# Patient Record
Sex: Male | Born: 1985 | Race: Black or African American | Hispanic: No | Marital: Married | Smoking: Current every day smoker
Health system: Southern US, Community
[De-identification: ages and names within clinical notes are randomized; demographics above are authoritative.]

## PROBLEM LIST (undated history)

## (undated) DIAGNOSIS — F319 Bipolar disorder, unspecified: Secondary | ICD-10-CM

---

## 2006-02-24 ENCOUNTER — Emergency Department: Payer: Self-pay | Admitting: Emergency Medicine

## 2006-09-09 ENCOUNTER — Emergency Department: Payer: Self-pay | Admitting: Unknown Physician Specialty

## 2007-05-18 ENCOUNTER — Ambulatory Visit: Payer: Self-pay | Admitting: Nurse Practitioner

## 2008-03-06 ENCOUNTER — Emergency Department: Payer: Self-pay | Admitting: Emergency Medicine

## 2008-06-19 ENCOUNTER — Inpatient Hospital Stay: Payer: Self-pay | Admitting: Psychiatry

## 2008-07-02 ENCOUNTER — Emergency Department: Payer: Self-pay | Admitting: Internal Medicine

## 2010-11-16 ENCOUNTER — Emergency Department: Payer: Self-pay | Admitting: Emergency Medicine

## 2010-11-18 ENCOUNTER — Emergency Department: Payer: Self-pay | Admitting: Emergency Medicine

## 2010-11-20 ENCOUNTER — Emergency Department: Payer: Self-pay | Admitting: Emergency Medicine

## 2010-12-05 ENCOUNTER — Emergency Department: Payer: Self-pay | Admitting: Emergency Medicine

## 2013-01-29 IMAGING — CR DG LUMBAR SPINE 2-3V
1 series · 3 of 3 positions shown · non-contrast
Comparison: none

REASON FOR EXAM: worsening pain with radiation posterior thigh sp fall
COMMENTS:

[Series 1: view not recorded · 0.17mm/px · 3 of 3 slices shown]
[im 1/3]
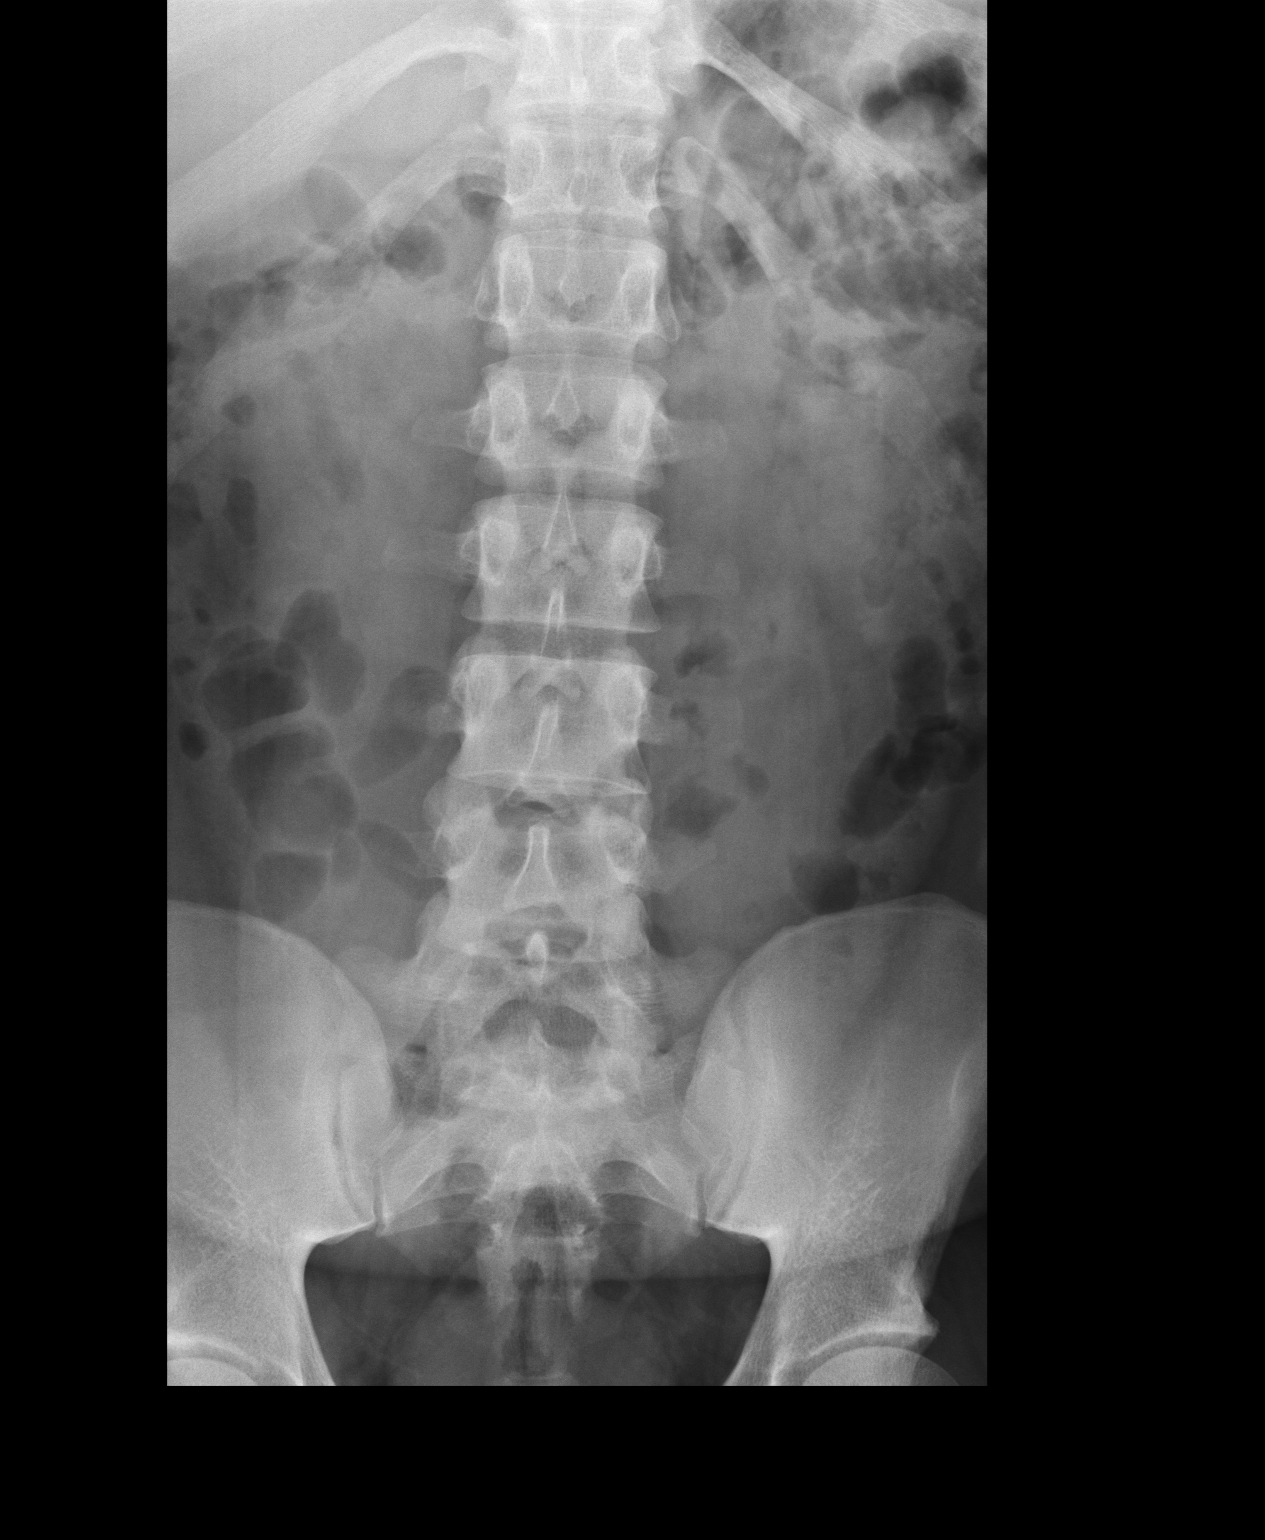
[im 2/3]
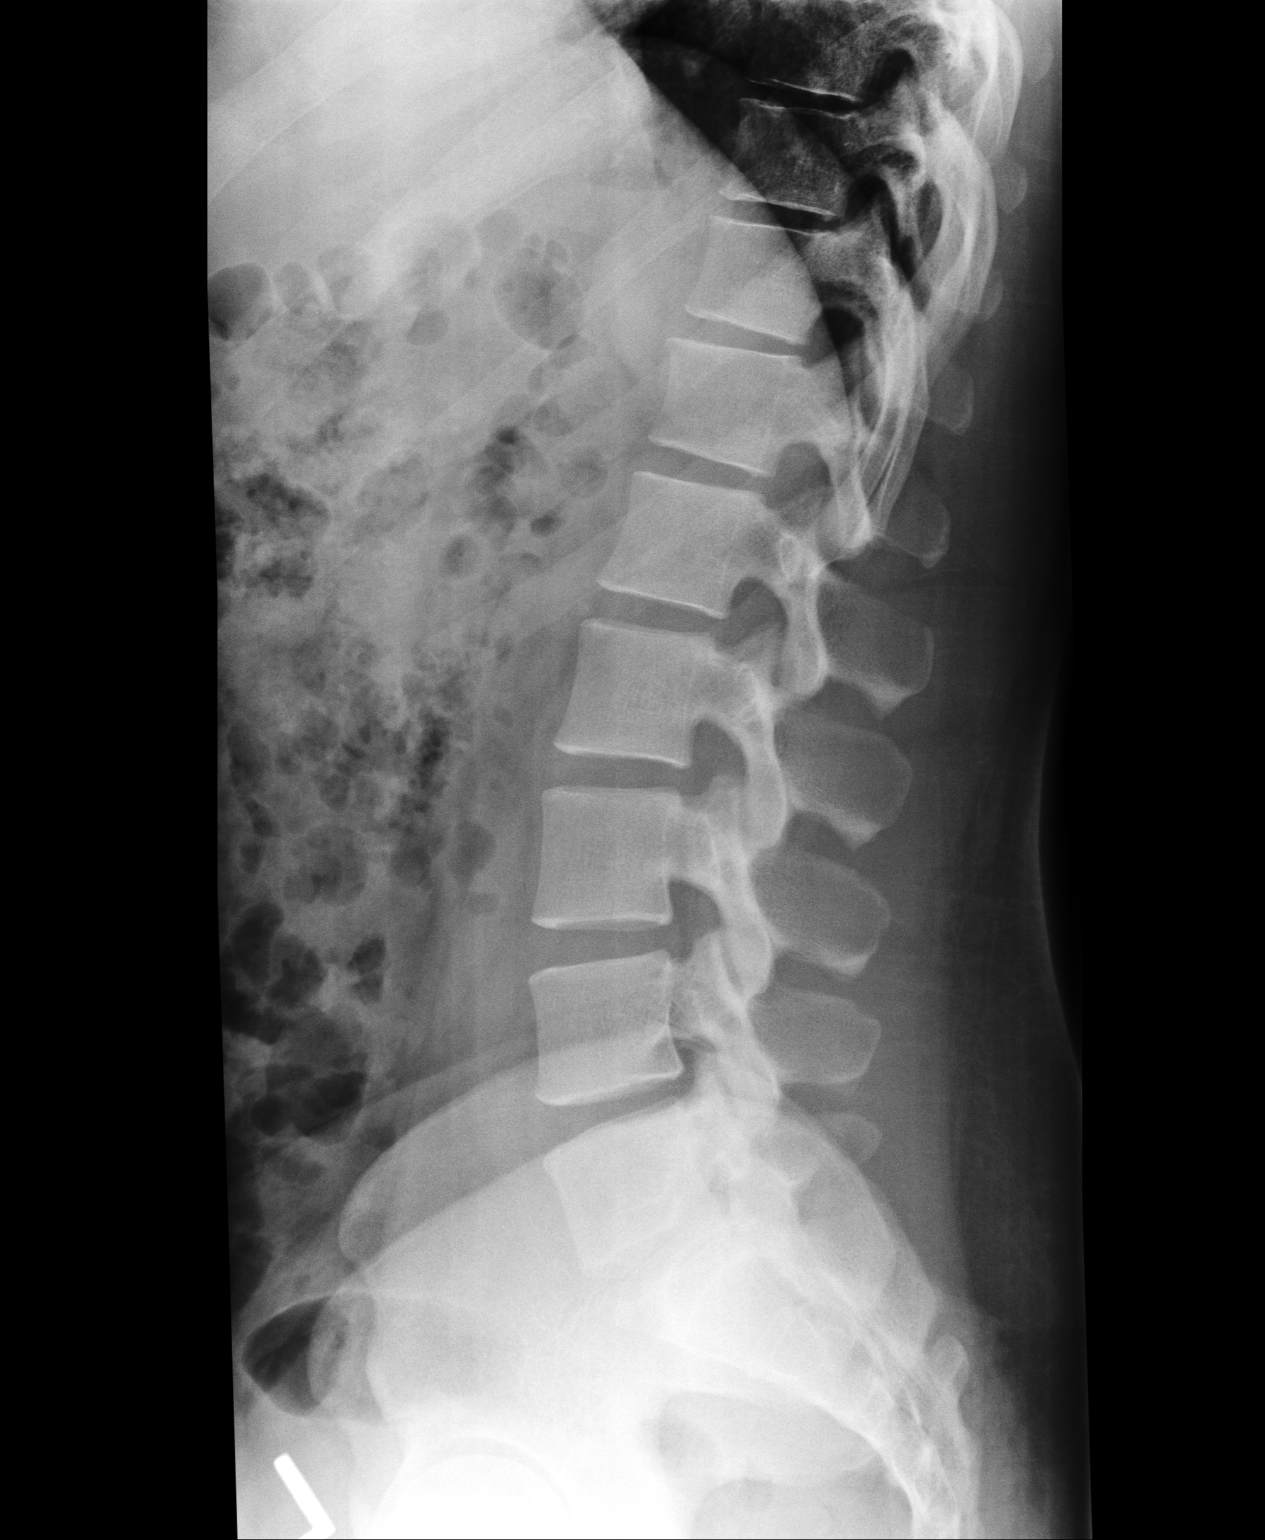
[im 3/3]
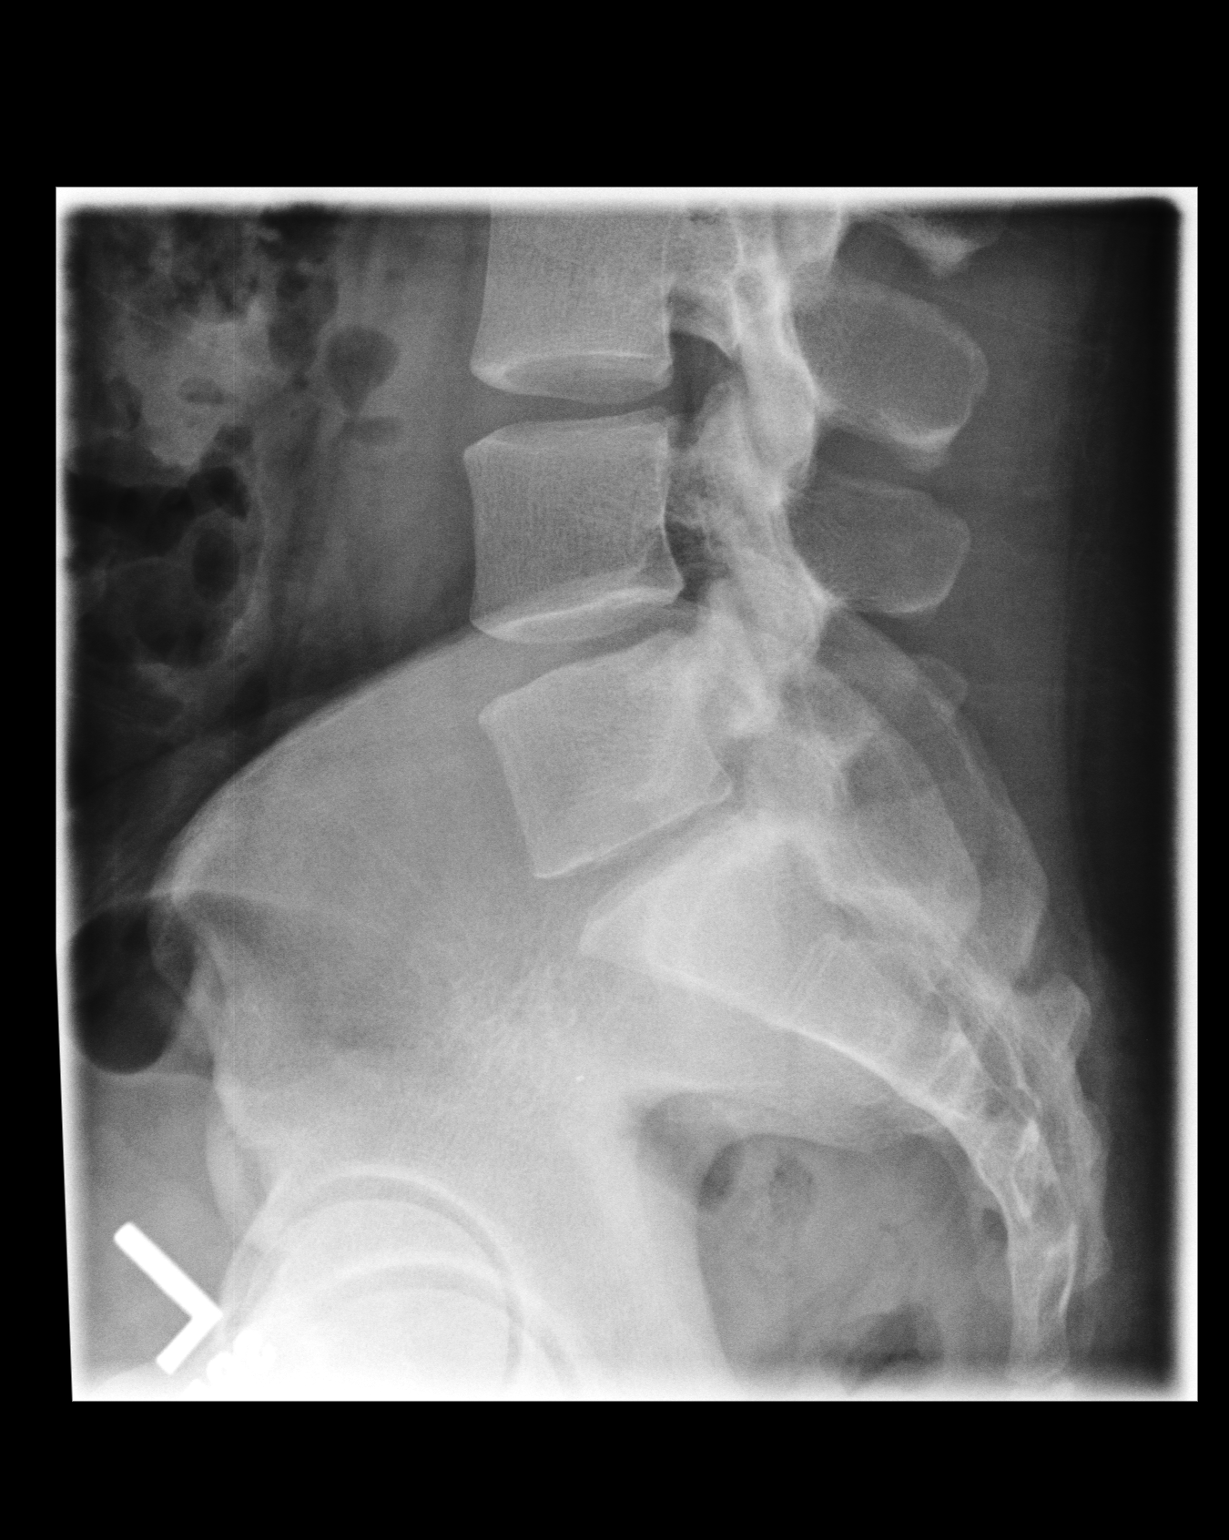

[3 of 3 positions shown; findings below may reference images not displayed]

PROCEDURE:     DXR - DXR LUMBAR SPINE AP AND LATERAL  - November 20, 2010  [DATE]

RESULT:     Comparison is made to the study 18 May, 2007.

The lumbar vertebral bodies are preserved in height. There is mild disc
space narrowing at L4-L5 which is slightly more conspicuous than on the
earlier study. The spinous processes are intact. I see no spondylolisthesis.
The transverse processes are grossly normal. The SI joints exhibit no acute
abnormality.
IMPRESSION: There is disc space narrowing at the L4-L5 level which is
not new but which is slightly more conspicuous than on the previous study.
There is no evidence of an acute compression fracture nor of a pars defect
or spondylolisthesis.

## 2016-10-23 ENCOUNTER — Emergency Department
Admission: EM | Admit: 2016-10-23 | Discharge: 2016-10-24 | Disposition: A | Discharge status: Home / Self Care | Payer: Self-pay | Attending: Emergency Medicine | Admitting: Emergency Medicine

## 2016-10-23 DIAGNOSIS — F1210 Cannabis abuse, uncomplicated: Secondary | ICD-10-CM

## 2016-10-23 DIAGNOSIS — R45850 Homicidal ideations: Secondary | ICD-10-CM | POA: Insufficient documentation

## 2016-10-23 DIAGNOSIS — F17200 Nicotine dependence, unspecified, uncomplicated: Secondary | ICD-10-CM | POA: Insufficient documentation

## 2016-10-23 DIAGNOSIS — F29 Unspecified psychosis not due to a substance or known physiological condition: Secondary | ICD-10-CM | POA: Insufficient documentation

## 2016-10-23 DIAGNOSIS — F4325 Adjustment disorder with mixed disturbance of emotions and conduct: Secondary | ICD-10-CM

## 2016-10-23 HISTORY — DX: Bipolar disorder, unspecified: F31.9

## 2016-10-23 LAB — URINE DRUG SCREEN, QUALITATIVE (ARMC ONLY)
AMPHETAMINES, UR SCREEN: NOT DETECTED
Barbiturates, Ur Screen: NOT DETECTED
Benzodiazepine, Ur Scrn: NOT DETECTED
CANNABINOID 50 NG, UR ~~LOC~~: POSITIVE — AB
COCAINE METABOLITE, UR ~~LOC~~: NOT DETECTED
MDMA (ECSTASY) UR SCREEN: NOT DETECTED
Methadone Scn, Ur: NOT DETECTED
Opiate, Ur Screen: NOT DETECTED
Phencyclidine (PCP) Ur S: NOT DETECTED
TRICYCLIC, UR SCREEN: NOT DETECTED

## 2016-10-23 LAB — COMPREHENSIVE METABOLIC PANEL
ALT: 17 U/L (ref 17–63)
AST: 19 U/L (ref 15–41)
Albumin: 4.6 g/dL (ref 3.5–5.0)
Alkaline Phosphatase: 60 U/L (ref 38–126)
Anion gap: 8 (ref 5–15)
BUN: 12 mg/dL (ref 6–20)
CHLORIDE: 111 mmol/L (ref 101–111)
CO2: 22 mmol/L (ref 22–32)
CREATININE: 1.23 mg/dL (ref 0.61–1.24)
Calcium: 9.3 mg/dL (ref 8.9–10.3)
GFR calc non Af Amer: >60 mL/min (ref >60)
Glucose, Bld: 100 mg/dL — ABNORMAL HIGH (ref 65–99)
POTASSIUM: 3.8 mmol/L (ref 3.5–5.1)
SODIUM: 141 mmol/L (ref 135–145)
Total Bilirubin: 0.6 mg/dL (ref 0.3–1.2)
Total Protein: 7.8 g/dL (ref 6.5–8.1)

## 2016-10-23 LAB — CBC
HCT: 49.5 % (ref 40.0–52.0)
Hemoglobin: 17 g/dL (ref 13.0–18.0)
MCH: 32.4 pg (ref 26.0–34.0)
MCHC: 34.4 g/dL (ref 32.0–36.0)
MCV: 94 fL (ref 80.0–100.0)
Platelets: 146 10*3/uL — ABNORMAL LOW (ref 150–440)
RBC: 5.27 MIL/uL (ref 4.40–5.90)
RDW: 12.5 % (ref 11.5–14.5)
WBC: 7.7 10*3/uL (ref 3.8–10.6)

## 2016-10-23 LAB — ETHANOL: Alcohol, Ethyl (B): <5 mg/dL (ref <5)

## 2016-10-23 LAB — ACETAMINOPHEN LEVEL: Acetaminophen (Tylenol), Serum: <10 ug/mL — ABNORMAL LOW (ref 10–30)

## 2016-10-23 LAB — SALICYLATE LEVEL

## 2016-10-23 NOTE — ED Notes (Signed)
Pt. 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  Pt. Believes he has a twin, "like a stunt double".   Pt. Denies pych. Hx.  Pt. Denies he is on any medication.  Pt. States he has moved around a lot from [place]  Pt. Denies SI and would only hurt someone if his life was in danger.  Pt. Cooperative with this nurse, but paranoid of family members and intentions of family members.
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
## 2016-10-23 NOTE — ED Notes (Signed)
PT IVC/ PENDING SOC CONSULT  

## 2016-10-23 NOTE — ED Notes (Signed)
Pt. Requested meal tray and something to drink.  Pt. Given meal tray and cup of water.  Pt. Was grateful for food.
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
## 2016-10-23 NOTE — ED Notes (Signed)
Pt. To BHU from ED ambulatory without difficulty, to room  [facility]. [facility] Pt. Is alert and oriented, warm and dry in no distress. Pt. Denies SI, HI, and AVH. Pt. Calm and cooperative. Pt. Made aware of security cameras and Q15 minute rounds. Pt. Encouraged to let Nursing staff know of any concerns or needs.

## 2016-10-23 NOTE — ED Notes (Signed)
[facility]

## 2016-10-23 NOTE — ED Notes (Signed)
SOC    CALLED 
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
## 2016-10-23 NOTE — ED Notes (Signed)

## 2016-10-24 DIAGNOSIS — F4325 Adjustment disorder with mixed disturbance of emotions and conduct: Secondary | ICD-10-CM

## 2016-10-24 DIAGNOSIS — F1210 Cannabis abuse, uncomplicated: Secondary | ICD-10-CM

## 2016-10-24 MED ORDER — LORAZEPAM 1 MG PO TABS: 1.0000 mg | ORAL_TABLET | ORAL | Status: DC | PRN

## 2016-10-24 NOTE — ED Notes (Signed)
Pt discharged to lobby. All belongings returned to patient. Denies SI/HI and AVH. Discharge paperwork reviewed with patient.

## 2016-10-24 NOTE — ED Notes (Signed)
Patient resting quietly in room. No noted distress or abnormal behaviors noted. Will continue 15 minute checks and observation by security camera for safety. 

## 2016-10-24 NOTE — ED Notes (Signed)
SOC complete. SOC recommends inpatient hospitalization at this time.
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
## 2016-10-24 NOTE — BH Assessment (Signed)
Assessment Note  Miguel Reed is an 31 y.o. male presenting to the Ed under IVC, initiated by his family, after expressing homicidal threats against family members.  Patient [place] was [place] a family member's funeral.  He reports getting into an argument with family members and says that he felt threatened.  Patients states, "I will defend myself if they come at me".    Patient denies SI and any auditory/visual hallucinations.  He denies any previous psychiatric hospitalizations/treatment.  Pt denies drug/alcohol use although his UDS was positive for cannabis.  Diagnosis: Bipolar Disorder  Past Medical History:  Past Medical History:  Diagnosis Date  . Bipolar 1 disorder (HCC)     History reviewed. No pertinent surgical history.  Family History: No family history on file.  Social History:  reports that he has been smoking.  He has never used smokeless tobacco. He reports that he drinks alcohol. He reports that he uses drugs, including Marijuana.  Additional Social History:  Alcohol / Drug Use Pain Medications: See PTA Prescriptions: See PTA Over the Counter: See PTA History of alcohol / drug use?: No history of alcohol / drug abuse (Pt denies)  CIWA: CIWA-Ar BP: 123/87 Pulse Rate: 70 COWS:    Allergies: No Known Allergies  Home Medications:  (Not in a hospital admission)  OB/GYN Status:  No LMP for male patient.  General Assessment Data Location of Assessment: [facility] ED TTS Assessment: In system Is this a Tele or Face-to-Face Assessment?: Face-to-Face Is this an Initial Assessment or a Re-assessment for this encounter?: Initial Assessment Marital status: Single Maiden name: n/a Is patient pregnant?: No Pregnancy Status: No Living Arrangements: Other relatives Can pt return to current living arrangement?: Yes Admission Status: Involuntary Is patient capable of signing voluntary admission?: No (Pt IVC) Referral Source:  Self/Family/Friend Insurance type: None     Crisis Care Plan Living Arrangements: Other relatives Legal Guardian: Other: (self) Name of Psychiatrist: none reported Name of Therapist: none reported  Education Status Is patient currently in school?: No Current Grade: na Highest grade of school patient has completed: na Name of school: na Contact person: na  Risk to self with the past 6 months Suicidal Ideation: No Has patient been a risk to self within the past 6 months prior to admission? : No Suicidal Intent: No Has patient had any suicidal intent within the past 6 months prior to admission? : No Is patient at risk for suicide?: No Suicidal Plan?: No Has patient had any suicidal plan within the past 6 months prior to admission? : No Access to Means: No What has been your use of drugs/alcohol within the last 12 months?: Pt denies but tested positive for cannabis Previous Attempts/Gestures: No How many times?: 0 Other Self Harm Risks: none identified Triggers for Past Attempts: None known Intentional Self Injurious Behavior: None Family Suicide History: No Recent stressful life event(s): Conflict (Comment) Persecutory voices/beliefs?: No Depression: No Substance abuse history and/or treatment for substance abuse?: No (Pt denies but UDS positive for cannabis) Suicide prevention information given to non-admitted patients: Not applicable  Risk to Others within the past 6 months Homicidal Ideation: Yes-Currently Present Does patient have any lifetime risk of violence toward others beyond the six months prior to admission? : No Thoughts of Harm to Others: Yes-Currently Present Comment - Thoughts of Harm to Others: Pt states "I will defend myself if they bother me" Current Homicidal Intent: No Current Homicidal Plan: No Access to Homicidal Means:  No Identified Victim: none identified History of harm to others?: No Assessment of Violence: None Noted Does patient have access  to weapons?: No Criminal Charges Pending?: No Does patient have a court date: No Is patient on probation?: Unknown  Psychosis Hallucinations: None noted Delusions: None noted  Mental Status Report Appearance/Hygiene: In scrubs Eye Contact: Good Motor Activity: Freedom of movement Speech: Logical/coherent, Aggressive Level of Consciousness: Irritable, Alert Mood: Suspicious, Irritable Affect: Irritable, Anxious Anxiety Level: Minimal Thought Processes: Relevant Judgement: Partial Orientation: Person, Place, Time, Situation Obsessive Compulsive Thoughts/Behaviors: None  Cognitive Functioning Concentration: Normal Memory: Recent Intact, Remote Intact IQ: Average Insight: Fair Impulse Control: Fair Appetite: Good Sleep: No Change Vegetative Symptoms: None  ADLScreening [facility] Assessment Services) Patient's cognitive ability adequate to safely complete daily activities?: Yes Patient able to express need for assistance with ADLs?: Yes Independently performs ADLs?: Yes (appropriate for developmental age)  Prior Inpatient Therapy Prior Inpatient Therapy: No Prior Therapy Dates: na Prior Therapy Facilty/Provider(s): na Reason for Treatment: na  Prior Outpatient Therapy Prior Outpatient Therapy: No Prior Therapy Dates: na Prior Therapy Facilty/Provider(s): na Reason for Treatment: na Does patient have an ACCT team?: Unknown Does patient have Intensive In-House Services?  : No Does patient have [facility] services? : Unknown Does patient have P4CC services?: Unknown  ADL Screening (condition at time of admission) Patient's cognitive ability adequate to safely complete daily activities?: Yes Is the patient deaf or have difficulty hearing?: No Does the patient have difficulty seeing, even when wearing glasses/contacts?: No Does the patient have difficulty concentrating, remembering, or making decisions?: No Patient able to express need for assistance with ADLs?: Yes Does the  patient have difficulty dressing or bathing?: No Independently performs ADLs?: Yes (appropriate for developmental age) Does the patient have difficulty walking or climbing stairs?: No Weakness of Legs: None Weakness of Arms/Hands: None  Home Assistive Devices/Equipment Home Assistive Devices/Equipment: None  Therapy Consults (therapy consults require a physician order) PT Evaluation Needed: No OT Evalulation Needed: No SLP Evaluation Needed: No Abuse/Neglect Assessment (Assessment to be complete while patient is alone) Physical Abuse: Denies Verbal Abuse: Denies Sexual Abuse: Denies Exploitation of patient/patient's resources: Denies Self-Neglect: Denies Values / Beliefs Cultural Requests During Hospitalization: None Spiritual Requests During Hospitalization: None Consults Spiritual Care Consult Needed: No Social Work Consult Needed: No Merchant navy officer (For Healthcare) Does Patient Have a Medical Advance Directive?: No Would patient like information on creating a medical advance directive?: No - Patient declined    Additional Information 1:1 In Past 12 Months?: No CIRT Risk: No Elopement Risk: No Does patient have medical clearance?: Yes     Disposition:  Disposition Initial Assessment Completed for this Encounter: Yes Disposition of Patient: Other dispositions Other disposition(s): Other (Comment) (Pending Psych MD consult)  On Site Evaluation by:   Reviewed with Physician:    Antonis Lor C Catie Chiao 10/24/2016 12:16 AM

## 2016-10-24 NOTE — ED Notes (Signed)
Pt unhappy about being in the hospital. "I'm not going to take any meds, I'm not crazy. I shouldn't be here."   Pt denies SI/HI and AVH.  Pt sitting in dayroom. No threatening or aggressive behavior. Maintained on 15 minute checks and observation by security camera for safety.

## 2016-10-24 NOTE — ED Notes (Signed)
Pt pacing the unit wanting to speak with the doctor.  No inappropriate or threatening behavior. Maintained on 15 minute checks and observation by security camera for safety.

## 2016-10-24 NOTE — ED Notes (Signed)
Pt to be discharged home. Pt accepting. Pt denies SI/HI and AVH.

## 2016-10-24 NOTE — ED Notes (Signed)
Report given to SOC 

## 2017-03-07 ENCOUNTER — Inpatient Hospital Stay
Admit: 2017-03-07 | Discharge: 2017-03-07 | Disposition: A | Discharge status: Home / Self Care | Payer: MEDICAID | Source: Other Acute Inpatient Hospital

## 2017-03-07 DIAGNOSIS — R0981 Nasal congestion: Secondary | ICD-10-CM

## 2017-03-07 DIAGNOSIS — J069 Acute upper respiratory infection, unspecified: Secondary | ICD-10-CM

## 2017-03-07 DIAGNOSIS — R0602 Shortness of breath: Secondary | ICD-10-CM

## 2017-03-07 MED ORDER — IBUPROFEN 600 MG PO TABS
600 mg | ORAL_TABLET | Freq: Four times a day (QID) | ORAL | 0 refills | Status: AC | PRN
Start: 2017-03-07 — End: ?

## 2017-03-07 MED ORDER — IPRATROPIUM BROMIDE 0.03 % NA SOLN
2 | Freq: Two times a day (BID) | NASAL | 0 refills | Status: AC
Start: 2017-03-07 — End: ?

## 2017-03-07 NOTE — ED Provider Notes
[facility]  Emergency Department Service Report    Victor Caldwell 32 y.o. male , presents with URI      Triage   Arrived on 03/07/2017 at 4:33 AM   Arrived by Walk-in [14]    ED Triage Vitals [03/07/17 0443]   Temp Temp Source BP Heart Rate Resp SpO2 O2 Device Pain Score Weight   37.3 ???C (99.1 ???F) Oral 124/77 105 18 96 % None (Room air) Zero --       Pre hospital care:       No Known Allergies    History   The history is provided by the patient.   Shortness of Breath    The current episode started 3 to 5 days ago. The onset was gradual. The problem occurs occasionally. The problem has been unchanged. The problem is mild. Nothing relieves the symptoms. The symptoms are aggravated by smoke exposure. Associated symptoms include rhinorrhea, cough and shortness of breath. Pertinent negatives include no chest pain, no chest pressure, no fever and no sore throat. Associated symptoms comments: Nasal congestion    . The cough has no precipitants. The cough is non-productive. The rhinorrhea has been occurring intermittently. There were no sick contacts.     Patient states that his main complaint is nasal congestion. Denies any fever or sore throat. No CP, nausea or vomiting.         History reviewed. No pertinent past medical history.     History reviewed. No pertinent surgical history.     Past Family History   family history is not on file.     Past Social History   he reports that he has been smoking.  He has never used smokeless tobacco. He reports that he does not drink alcohol or use drugs. His sexual activity history is not on file.     Review of Systems   Constitutional: Negative for fever.   HENT: Positive for rhinorrhea. Negative for sore throat.    Respiratory: Positive for cough and shortness of breath.    Cardiovascular: Negative for chest pain and palpitations.   Gastrointestinal: Negative for abdominal pain, nausea and vomiting.   All other systems reviewed and are negative. Physical Exam   Physical Exam   Constitutional: He appears well-developed and well-nourished. No distress.   HENT:   Head: Normocephalic and atraumatic.   Mouth/Throat: No oropharyngeal exudate.   Nasal congestion  NO Maxillary or frontal sinus TTP   Eyes: Conjunctivae are normal.   Neck: Normal range of motion. Neck supple.   Cardiovascular: Normal rate, regular rhythm and normal heart sounds.    Pulmonary/Chest: Effort normal and breath sounds normal. No respiratory distress.   Abdominal: Soft. Bowel sounds are normal. He exhibits no distension. There is no tenderness. There is no rebound, no guarding, no tenderness at McBurney's point and negative Murphy's sign.   Musculoskeletal: Normal range of motion.   Neurological: He is alert.   Skin: Skin is warm and dry.   Psychiatric: He has a normal mood and affect.   Nursing note and vitals reviewed.      ED Course             Laboratory Results   Labs Reviewed - No data to display    Imaging Results     No orders to display       Administered Medications     Medication Administration from 03/07/2017 0433 to 03/07/2017 0600     None  Procedures   Procedures    MDM    MDM:  Patient presents to ED with acute dyspnea, cough and congestio. Patient evaluated for acute causes of shortness of breath such as pneumonia, pneumothorax, pulmonary embolism with no evidence of these acute conditions. Patient with signs and symptoms of acute bronchitis. Patient without respiratory distress at the time of discharge. Normal cardiopulmonary exam and normal vital signs to rule out these other acute pathologies. No clinical evidence of pneumonia, otitis media, strep pharyngitis. Patient well appearing. Patent airway. Clinically stable for discharge. Patient tolerating PO without difficulty. Well hydrated. Will discharge with medication for symptom control.       Clinical Impression     1. Shortness of breath    2. Acute URI    3. Nasal congestion        Prescriptions New Prescriptions    IBUPROFEN 600 MG TABLET    Take 1 tablet (600 mg total) by mouth every six (6) hours as needed.    IPRATROPIUM 0.03% NASAL SPRAY    Spray 2 sprays in each nare every twelve (12) hours.       Disposition and Follow-up   Disposition: Discharge [1]    No future appointments.    Follow up with:  Pcp, No, MD    In 3 days      [facility] Emergency Department  [address]  [place] [address]  [phone]    If symptoms worsen      Return precautions are specified on After Visit Summary.                 Virl Cagey D., MD  03/07/17 [phone]

## 2017-09-26 ENCOUNTER — Emergency Department (HOSPITAL_COMMUNITY)
Admission: EM | Admit: 2017-09-26 | Discharge: 2017-09-27 | Disposition: A | Discharge status: Home / Self Care | Payer: Self-pay | Attending: Emergency Medicine | Admitting: Emergency Medicine

## 2017-09-26 ENCOUNTER — Emergency Department (HOSPITAL_COMMUNITY): Payer: Self-pay

## 2017-09-26 DIAGNOSIS — Y92521 Bus station as the place of occurrence of the external cause: Secondary | ICD-10-CM | POA: Insufficient documentation

## 2017-09-26 DIAGNOSIS — S060X0A Concussion without loss of consciousness, initial encounter: Secondary | ICD-10-CM | POA: Insufficient documentation

## 2017-09-26 DIAGNOSIS — F4325 Adjustment disorder with mixed disturbance of emotions and conduct: Secondary | ICD-10-CM | POA: Insufficient documentation

## 2017-09-26 DIAGNOSIS — F17200 Nicotine dependence, unspecified, uncomplicated: Secondary | ICD-10-CM | POA: Insufficient documentation

## 2017-09-26 DIAGNOSIS — Z23 Encounter for immunization: Secondary | ICD-10-CM | POA: Insufficient documentation

## 2017-09-26 DIAGNOSIS — S01511A Laceration without foreign body of lip, initial encounter: Secondary | ICD-10-CM | POA: Insufficient documentation

## 2017-09-26 DIAGNOSIS — M25521 Pain in right elbow: Secondary | ICD-10-CM | POA: Insufficient documentation

## 2017-09-26 DIAGNOSIS — S0083XA Contusion of other part of head, initial encounter: Secondary | ICD-10-CM

## 2017-09-26 DIAGNOSIS — Y998 Other external cause status: Secondary | ICD-10-CM | POA: Insufficient documentation

## 2017-09-26 DIAGNOSIS — F1210 Cannabis abuse, uncomplicated: Secondary | ICD-10-CM | POA: Insufficient documentation

## 2017-09-26 DIAGNOSIS — Y9389 Activity, other specified: Secondary | ICD-10-CM | POA: Insufficient documentation

## 2017-09-26 DIAGNOSIS — F319 Bipolar disorder, unspecified: Secondary | ICD-10-CM | POA: Insufficient documentation

## 2017-09-26 LAB — I-STAT CHEM 8, ED
BUN: 23 mg/dL — ABNORMAL HIGH (ref 6–20)
CALCIUM ION: 1.13 mmol/L — AB (ref 1.15–1.40)
Chloride: 107 mmol/L (ref 98–111)
Creatinine, Ser: 1.3 mg/dL — ABNORMAL HIGH (ref 0.61–1.24)
GLUCOSE: 99 mg/dL (ref 70–99)
HCT: 45 % (ref 39.0–52.0)
HEMOGLOBIN: 15.3 g/dL (ref 13.0–17.0)
Potassium: 4.3 mmol/L (ref 3.5–5.1)
Sodium: 141 mmol/L (ref 135–145)
TCO2: 25 mmol/L (ref 22–32)

## 2017-09-26 LAB — SAMPLE TO BLOOD BANK

## 2017-09-26 MED ORDER — TETANUS-DIPHTH-ACELL PERTUSSIS 5-2.5-18.5 LF-MCG/0.5 IM SUSP
0.5000 mL | Freq: Once | INTRAMUSCULAR | Status: AC
  Administered 2017-09-26: 0.5 mL via INTRAMUSCULAR
  Filled 2017-09-26: qty 0.5

## 2017-09-26 MED ORDER — SODIUM CHLORIDE 0.9 % IV BOLUS: 1000.0000 mL | Freq: Once | INTRAVENOUS | Status: DC
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
## 2017-09-26 NOTE — ED Notes (Signed)
Patient transported to CT 
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
## 2017-09-27 LAB — CBC WITH DIFFERENTIAL/PLATELET
Abs Immature Granulocytes: 0.1 10*3/uL (ref 0.0–0.1)
BASOS PCT: 0 %
Basophils Absolute: 0.1 10*3/uL (ref 0.0–0.1)
EOS ABS: 0.1 10*3/uL (ref 0.0–0.7)
EOS PCT: 1 %
HEMATOCRIT: 43.2 % (ref 39.0–52.0)
Hemoglobin: 14.8 g/dL (ref 13.0–17.0)
IMMATURE GRANULOCYTES: 1 %
LYMPHS ABS: 2 10*3/uL (ref 0.7–4.0)
Lymphocytes Relative: 12 %
MCH: 31.5 pg (ref 26.0–34.0)
MCHC: 34.3 g/dL (ref 30.0–36.0)
MCV: 91.9 fL (ref 78.0–100.0)
MONO ABS: 1.1 10*3/uL — AB (ref 0.1–1.0)
MONOS PCT: 7 %
NEUTROS PCT: 79 %
Neutro Abs: 12.8 10*3/uL — ABNORMAL HIGH (ref 1.7–7.7)
Platelets: 178 10*3/uL (ref 150–400)
RBC: 4.7 MIL/uL (ref 4.22–5.81)
RDW: 11.5 % (ref 11.5–15.5)
WBC: 16.2 10*3/uL — ABNORMAL HIGH (ref 4.0–10.5)

## 2017-09-27 LAB — URINALYSIS, ROUTINE W REFLEX MICROSCOPIC
Bilirubin Urine: NEGATIVE
GLUCOSE, UA: NEGATIVE mg/dL
Hgb urine dipstick: NEGATIVE
Ketones, ur: 20 mg/dL — AB
LEUKOCYTES UA: NEGATIVE
NITRITE: NEGATIVE
PH: 6 (ref 5.0–8.0)
Protein, ur: 30 mg/dL — AB
Specific Gravity, Urine: 1.031 — ABNORMAL HIGH (ref 1.005–1.030)

## 2017-09-27 LAB — RAPID URINE DRUG SCREEN, HOSP PERFORMED
Amphetamines: NOT DETECTED
BARBITURATES: NOT DETECTED
BENZODIAZEPINES: NOT DETECTED
Cocaine: NOT DETECTED
Opiates: NOT DETECTED
Tetrahydrocannabinol: NOT DETECTED

## 2017-09-27 LAB — COMPREHENSIVE METABOLIC PANEL
ALBUMIN: 4 g/dL (ref 3.5–5.0)
ALK PHOS: 69 U/L (ref 38–126)
ALT: 25 U/L (ref 0–44)
AST: 66 U/L — ABNORMAL HIGH (ref 15–41)
Anion gap: 9 (ref 5–15)
BUN: 14 mg/dL (ref 6–20)
CALCIUM: 9 mg/dL (ref 8.9–10.3)
CHLORIDE: 108 mmol/L (ref 98–111)
CO2: 23 mmol/L (ref 22–32)
CREATININE: 1.36 mg/dL — AB (ref 0.61–1.24)
GFR calc Af Amer: >60 mL/min (ref >60)
GFR calc non Af Amer: >60 mL/min (ref >60)
GLUCOSE: 100 mg/dL — AB (ref 70–99)
Potassium: 4.3 mmol/L (ref 3.5–5.1)
SODIUM: 140 mmol/L (ref 135–145)
Total Bilirubin: 1.7 mg/dL — ABNORMAL HIGH (ref 0.3–1.2)
Total Protein: 6.5 g/dL (ref 6.5–8.1)

## 2017-09-27 LAB — PROTIME-INR
INR: 1.09
Prothrombin Time: 14 seconds (ref 11.4–15.2)

## 2017-09-27 LAB — ETHANOL

## 2017-09-27 MED ORDER — LIDOCAINE-EPINEPHRINE (PF) 2 %-1:200000 IJ SOLN
10.0000 mL | Freq: Once | INTRAMUSCULAR | Status: AC
  Administered 2017-09-27: 10 mL
  Filled 2017-09-27: qty 20

## 2017-09-27 MED ORDER — AMOXICILLIN-POT CLAVULANATE 875-125 MG PO TABS: 1.0000 | ORAL_TABLET | Freq: Two times a day (BID) | ORAL | 0 refills | Status: AC

## 2017-09-27 MED ORDER — ACETAMINOPHEN 500 MG PO TABS
1000.0000 mg | ORAL_TABLET | Freq: Once | ORAL | Status: AC
  Administered 2017-09-27: 1000 mg via ORAL
  Filled 2017-09-27: qty 2

## 2017-09-27 MED ORDER — IBUPROFEN 400 MG PO TABS
600.0000 mg | ORAL_TABLET | Freq: Once | ORAL | Status: AC
  Administered 2017-09-27: 600 mg via ORAL
  Filled 2017-09-27: qty 1

## 2017-09-27 MED ORDER — AMOXICILLIN-POT CLAVULANATE 875-125 MG PO TABS
1.0000 | ORAL_TABLET | Freq: Once | ORAL | Status: AC
  Administered 2017-09-27: 1 via ORAL
  Filled 2017-09-27: qty 1

## 2017-09-27 NOTE — ED Notes (Signed)
[facility]ound [facility].
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
## 2018-12-05 ENCOUNTER — Other Ambulatory Visit: Payer: Self-pay

## 2018-12-05 DIAGNOSIS — Z20822 Contact with and (suspected) exposure to covid-19: Secondary | ICD-10-CM

## 2018-12-06 LAB — NOVEL CORONAVIRUS, NAA: SARS-CoV-2, NAA: NOT DETECTED

## 2019-01-13 ENCOUNTER — Ambulatory Visit: Payer: MEDICAID
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
